# Patient Record
Sex: Male | Born: 2000 | Hispanic: Yes | Marital: Single | Smoking: Current every day smoker
Health system: Southern US, Community
[De-identification: ages and names within clinical notes are randomized; demographics above are authoritative.]

---
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
## 2022-10-30 IMAGING — CT CT RENAL STONE PROTOCOL
2 of 4 series · 16 of 46 positions shown, 18 images · non-contrast
Comparison: None.

CLINICAL DATA: Right flank pain

EXAM:
CT ABDOMEN AND PELVIS WITHOUT CONTRAST
TECHNIQUE: Multidetector CT imaging of the abdomen and pelvis was performed
following the standard protocol without IV contrast.

[Series 3: stone full standard · axial · 0.95mm/px · z∈[+748,+1244]mm · 13 of 109 slices shown, 15 images]
[im 5/109  soft-tissue]
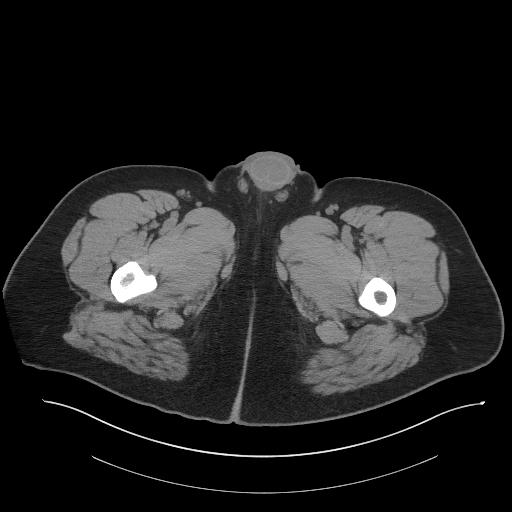
[im 5/109  bone]
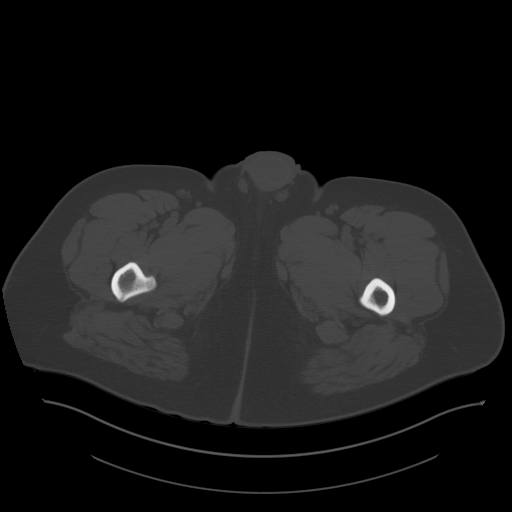
[im 15/109  soft-tissue]
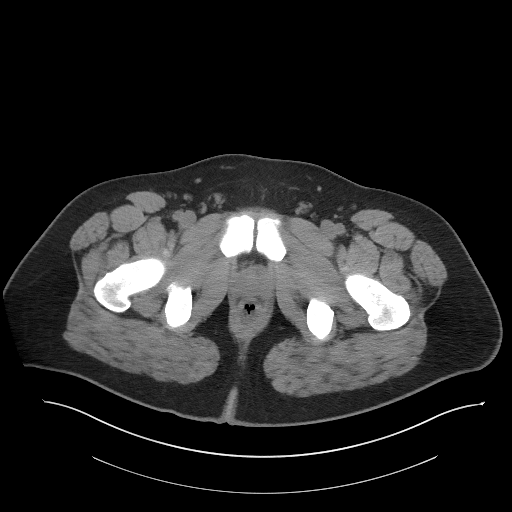
[im 25/109  soft-tissue]
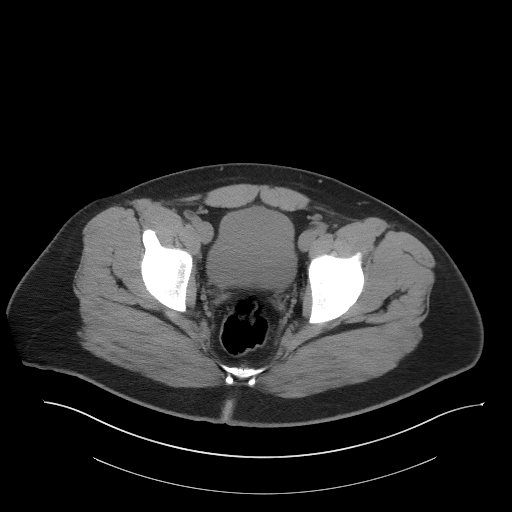
[im 30/109  soft-tissue]
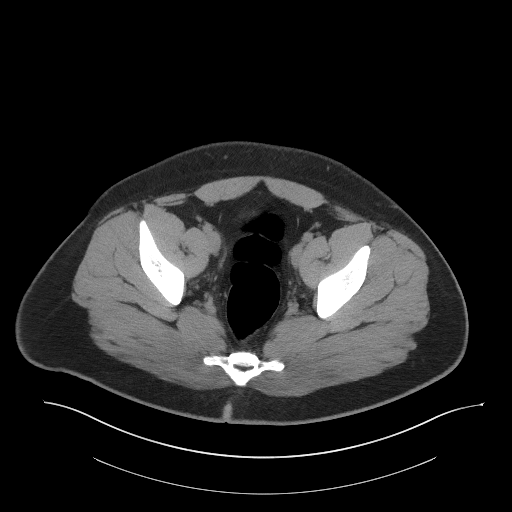
[im 40/109  soft-tissue]
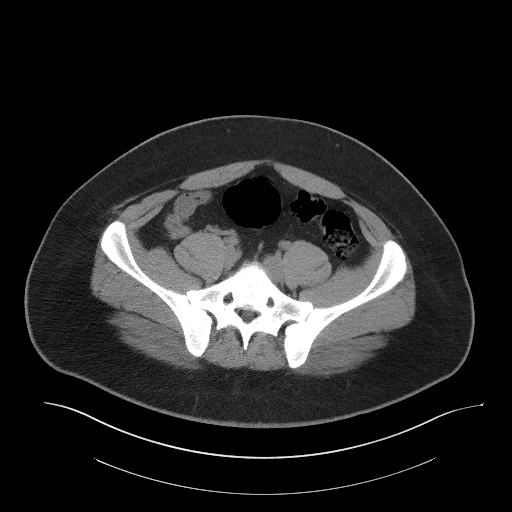
[im 45/109  soft-tissue]
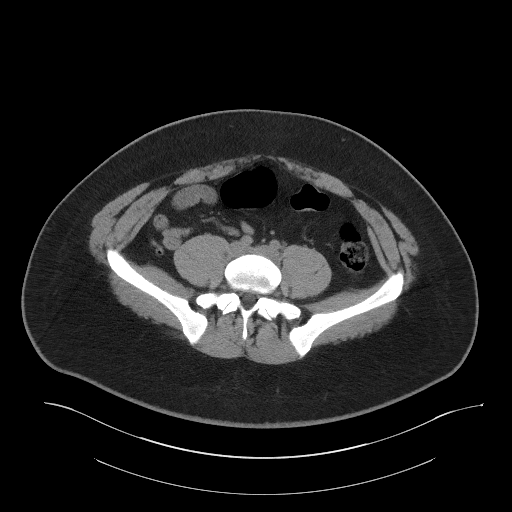
[im 55/109  soft-tissue]
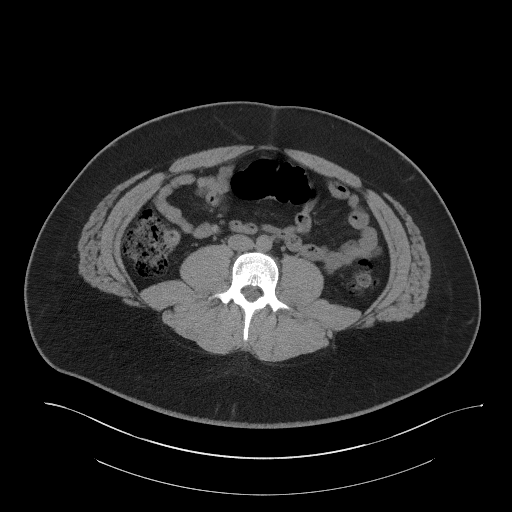
[im 64/109  soft-tissue]
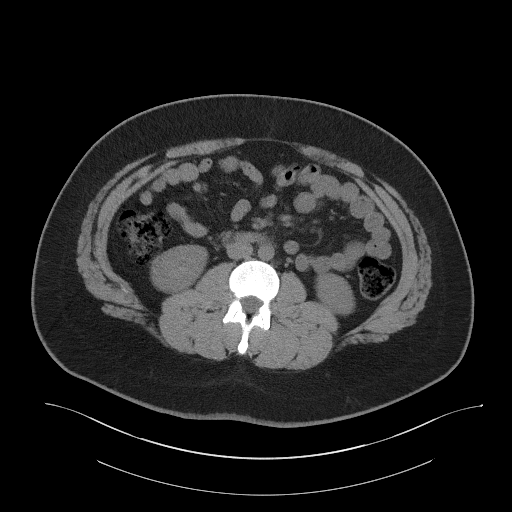
[im 69/109  soft-tissue]
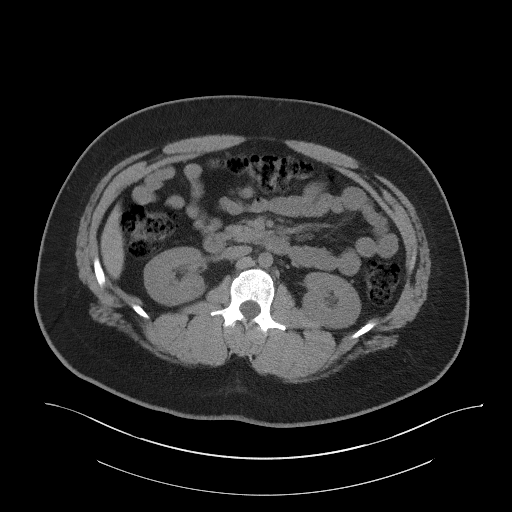
[im 69/109  bone]
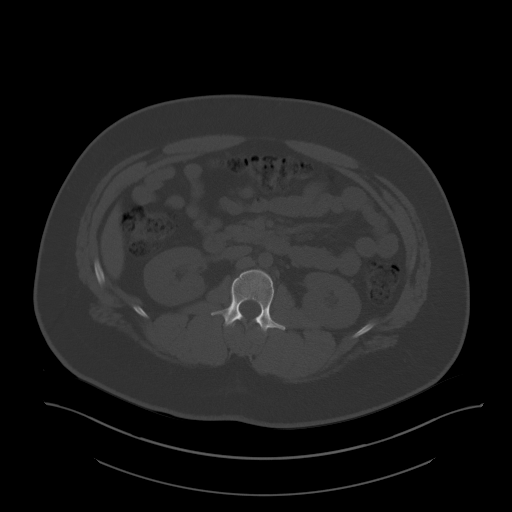
[im 79/109  soft-tissue]
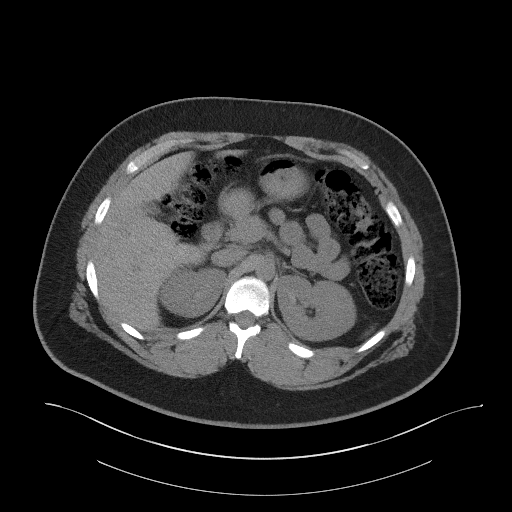
[im 84/109  soft-tissue]
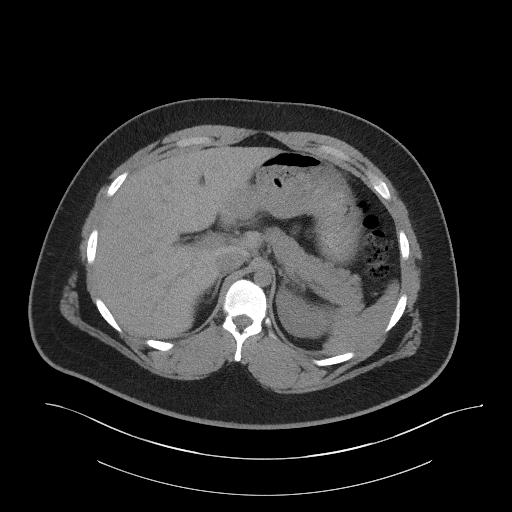
[im 94/109  soft-tissue]
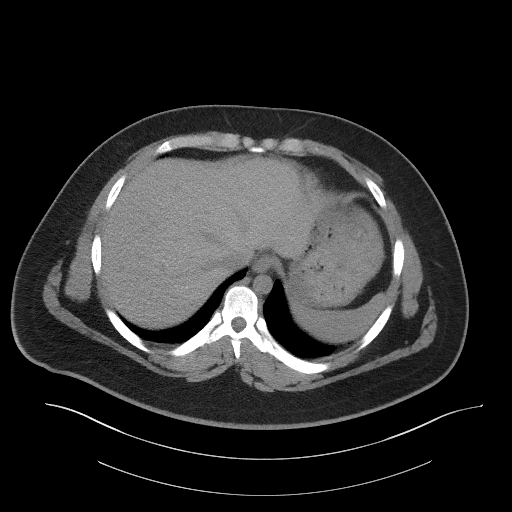
[im 104/109  soft-tissue]
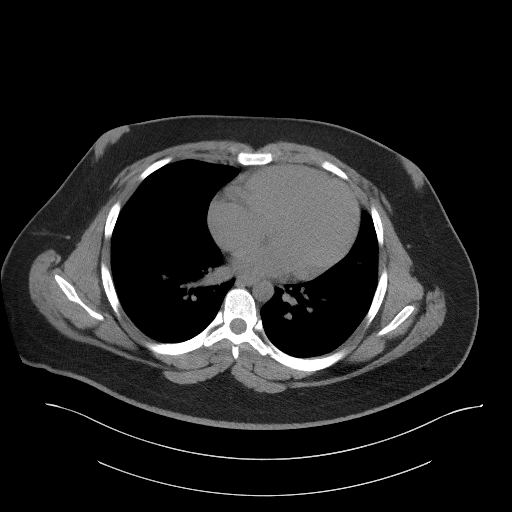

[Series 6: coronal · coronal · 0.75mm/px · 3 of 147 slices shown]
[im 49/147  soft-tissue]
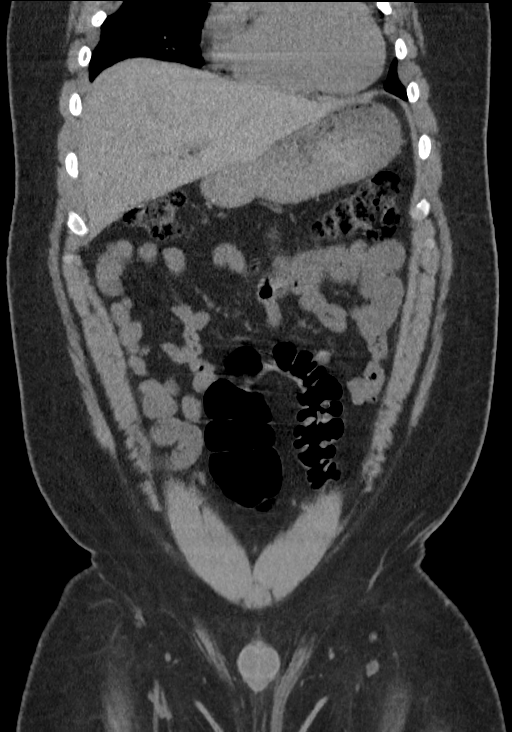
[im 65/147  soft-tissue]
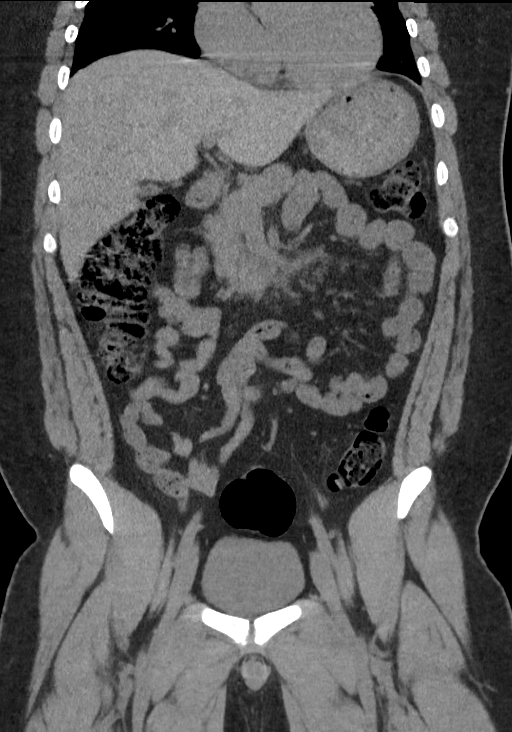
[im 82/147  soft-tissue]
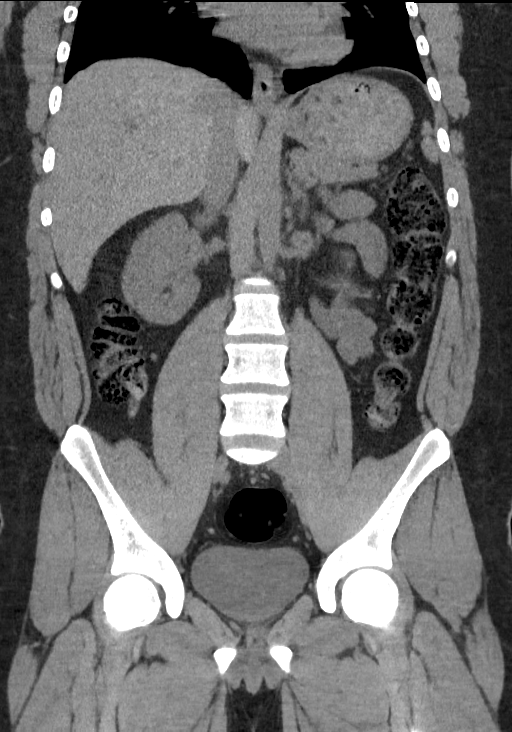

[16 of 46 positions shown; findings below may reference images not displayed]

FINDINGS: Lower chest: Gynecomastia. no contributory findings.

Hepatobiliary: No focal liver abnormality.No evidence of biliary
obstruction or stone.

Pancreas: Unremarkable.

Spleen: Unremarkable.

Adrenals/Urinary Tract: Negative adrenals. No hydronephrosis or
stone. Unremarkable bladder.

Stomach/Bowel:  No obstruction. No appendicitis.

Vascular/Lymphatic: No acute vascular abnormality. No mass or
adenopathy.

Reproductive:No pathologic findings.

Other: No ascites or pneumoperitoneum.

Musculoskeletal: No acute abnormalities.
IMPRESSION: No acute finding.  No urolithiasis or appendicitis.
